# Patient Record
Sex: Female | Born: 1955 | Race: White | Hispanic: No | Marital: Married | Smoking: Never smoker
Health system: Southern US, Community
[De-identification: ages and names within clinical notes are randomized; demographics above are authoritative.]

## PROBLEM LIST (undated history)

## (undated) DIAGNOSIS — E049 Nontoxic goiter, unspecified: Secondary | ICD-10-CM

## (undated) DIAGNOSIS — R51 Headache: Secondary | ICD-10-CM

## (undated) DIAGNOSIS — Z780 Asymptomatic menopausal state: Secondary | ICD-10-CM

## (undated) HISTORY — DX: Asymptomatic menopausal state: Z78.0

## (undated) HISTORY — DX: Nontoxic goiter, unspecified: E04.9

## (undated) HISTORY — PX: EYE SURGERY: SHX253

## (undated) HISTORY — PX: DILATION AND CURETTAGE OF UTERUS: SHX78

## (undated) HISTORY — PX: COLONOSCOPY: SHX174

---

## 1998-09-01 ENCOUNTER — Other Ambulatory Visit: Admission: RE | Admit: 1998-09-01 | Discharge: 1998-09-01 | Payer: Self-pay | Admitting: Obstetrics and Gynecology

## 1999-10-15 ENCOUNTER — Other Ambulatory Visit: Admission: RE | Admit: 1999-10-15 | Discharge: 1999-10-15 | Payer: Self-pay | Admitting: Obstetrics and Gynecology

## 2000-11-07 ENCOUNTER — Other Ambulatory Visit: Admission: RE | Admit: 2000-11-07 | Discharge: 2000-11-07 | Payer: Self-pay | Admitting: Obstetrics and Gynecology

## 2002-05-31 ENCOUNTER — Other Ambulatory Visit: Admission: RE | Admit: 2002-05-31 | Discharge: 2002-05-31 | Payer: Self-pay | Admitting: Obstetrics and Gynecology

## 2003-08-14 ENCOUNTER — Ambulatory Visit (HOSPITAL_COMMUNITY): Admission: RE | Admit: 2003-08-14 | Discharge: 2003-08-14 | Payer: Self-pay | Admitting: Family Medicine

## 2003-09-30 ENCOUNTER — Encounter (INDEPENDENT_AMBULATORY_CARE_PROVIDER_SITE_OTHER): Payer: Self-pay | Admitting: Specialist

## 2003-09-30 ENCOUNTER — Ambulatory Visit (HOSPITAL_COMMUNITY): Admission: RE | Admit: 2003-09-30 | Discharge: 2003-09-30 | Payer: Self-pay | Admitting: General Surgery

## 2003-11-06 ENCOUNTER — Other Ambulatory Visit: Admission: RE | Admit: 2003-11-06 | Discharge: 2003-11-06 | Payer: Self-pay | Admitting: Obstetrics and Gynecology

## 2004-03-17 ENCOUNTER — Ambulatory Visit (HOSPITAL_COMMUNITY): Admission: RE | Admit: 2004-03-17 | Discharge: 2004-03-17 | Payer: Self-pay | Admitting: Endocrinology

## 2004-04-01 ENCOUNTER — Encounter (INDEPENDENT_AMBULATORY_CARE_PROVIDER_SITE_OTHER): Payer: Self-pay | Admitting: *Deleted

## 2004-04-01 ENCOUNTER — Ambulatory Visit (HOSPITAL_COMMUNITY): Admission: RE | Admit: 2004-04-01 | Discharge: 2004-04-01 | Payer: Self-pay | Admitting: Endocrinology

## 2005-02-11 ENCOUNTER — Ambulatory Visit (HOSPITAL_COMMUNITY): Admission: RE | Admit: 2005-02-11 | Discharge: 2005-02-11 | Payer: Self-pay | Admitting: Endocrinology

## 2005-02-24 IMAGING — US US BIOPSY
1 series · 12 of 12 positions shown · non-contrast
Comparison: none

CLINICAL DATA: Patient with history of multinodular goiter and previous ultrasound-guided thyroid biopsy of both left and right nodules on 09/30/03 with benign findings.  In the interim, the patient has undergone a follow-up ultrasound of the thyroid on 03/17/04 at [HOSPITAL] which revealed interval enlargement of the dominant lesion in the inferior aspect of the left thyroid lobe which previously measured 3.1 x 2.7 x 3.3 cm and now measures 3.3 x 2.8 x 3.5 cm with some peripheral calcifications.   Request is made for fine needle aspiration of this dominant inferior left thyroid lobe nodule.
ULTRASOUND-GUIDED FINE NEEDLE ASPIRATION OF DOMINANT LEFT THYROID LOBE NODULE ? 04/01/04

[Series 1: unknown · 0.09mm/px · 12 of 12 slices shown]
[im 1/12]
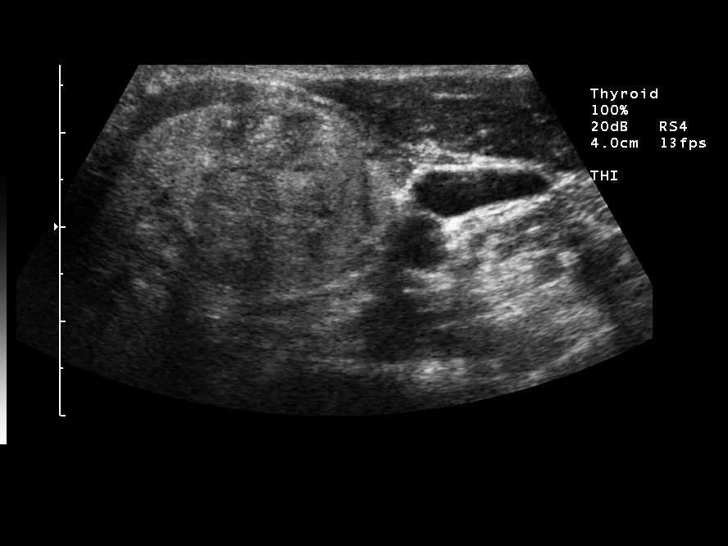
[im 2/12]
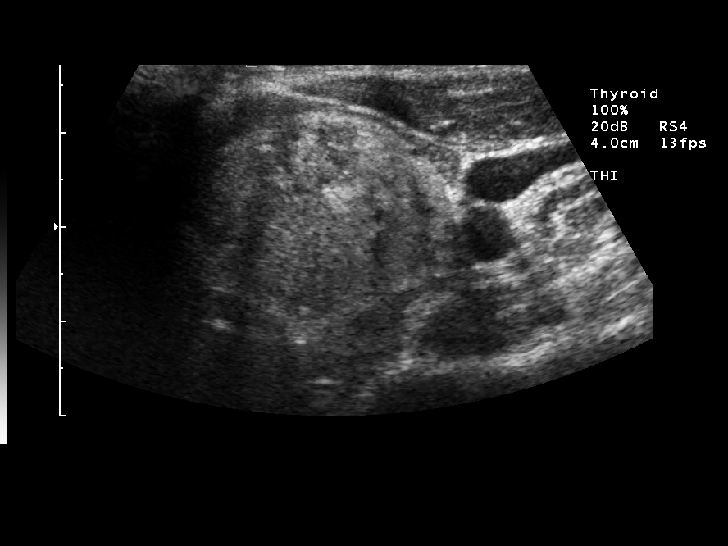
[im 3/12]
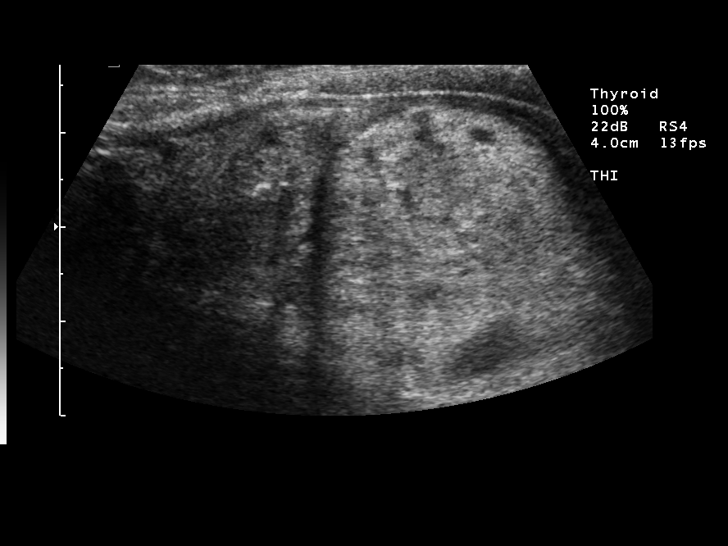
[im 4/12]
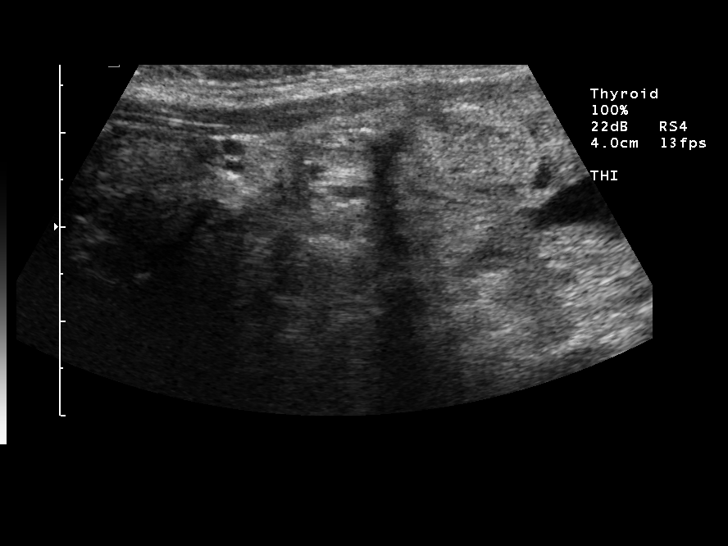
[im 5/12]
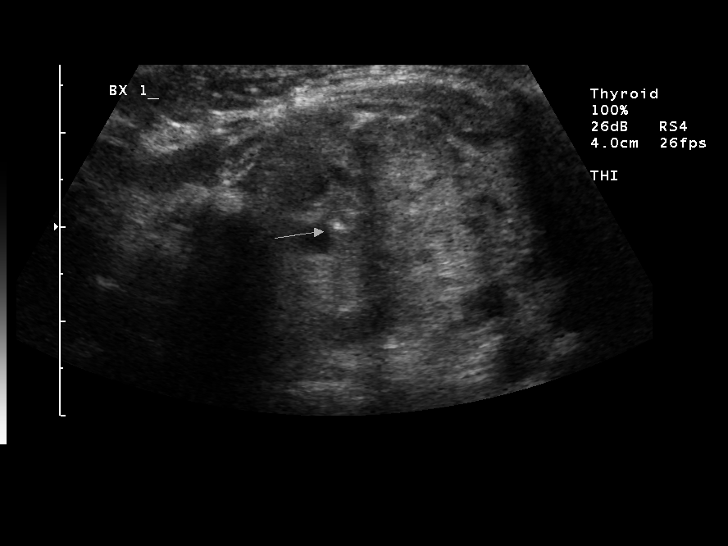
[im 6/12]
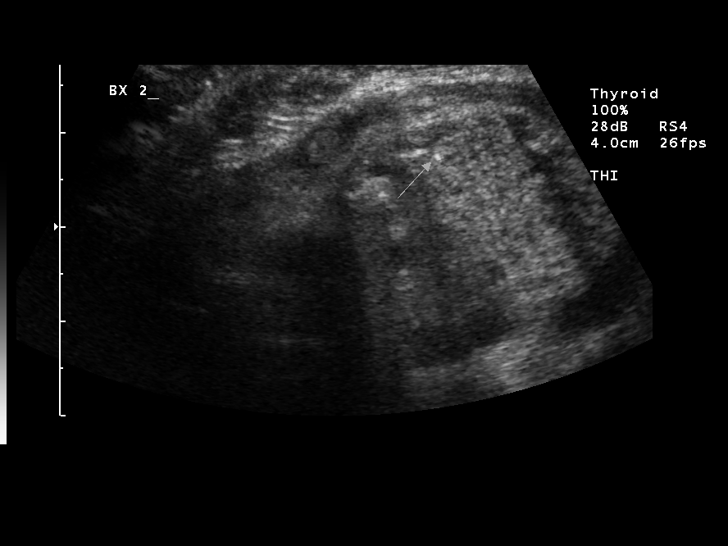
[im 7/12]
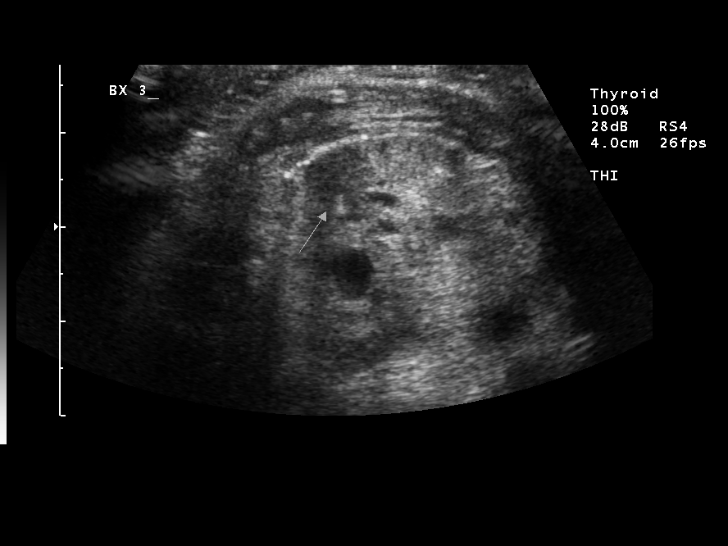
[im 8/12]
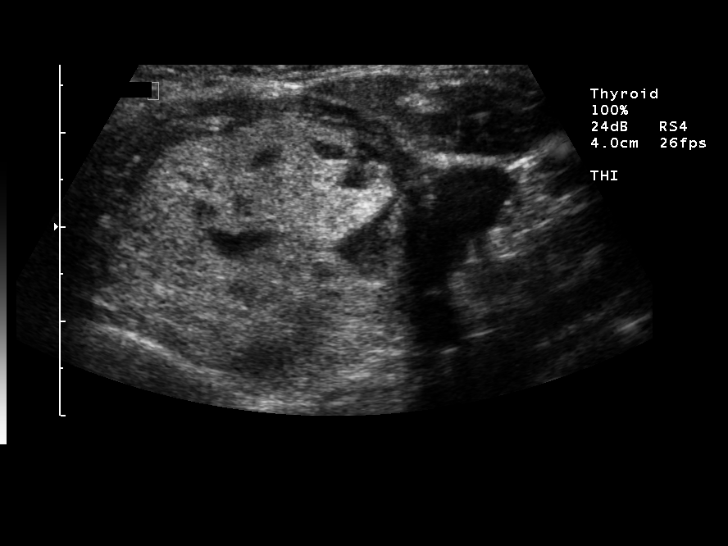
[im 9/12]
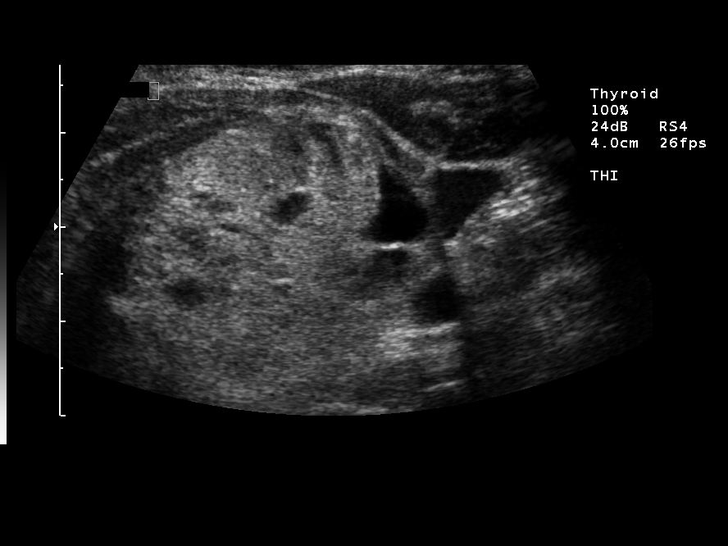
[im 10/12]
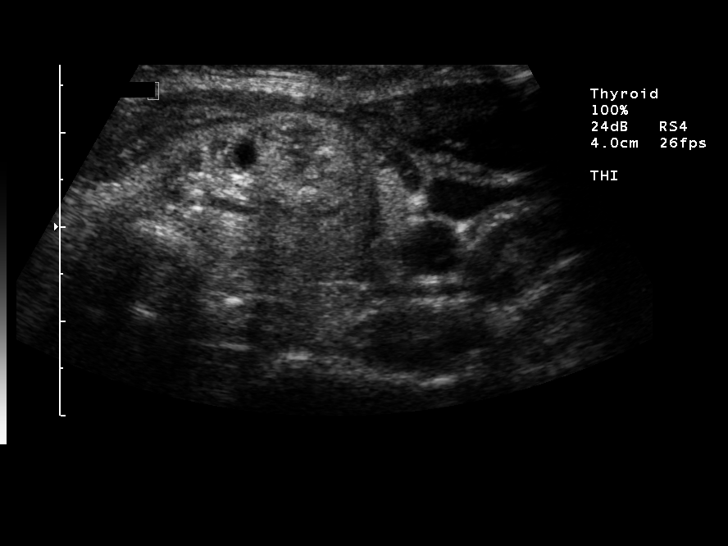
[im 11/12]
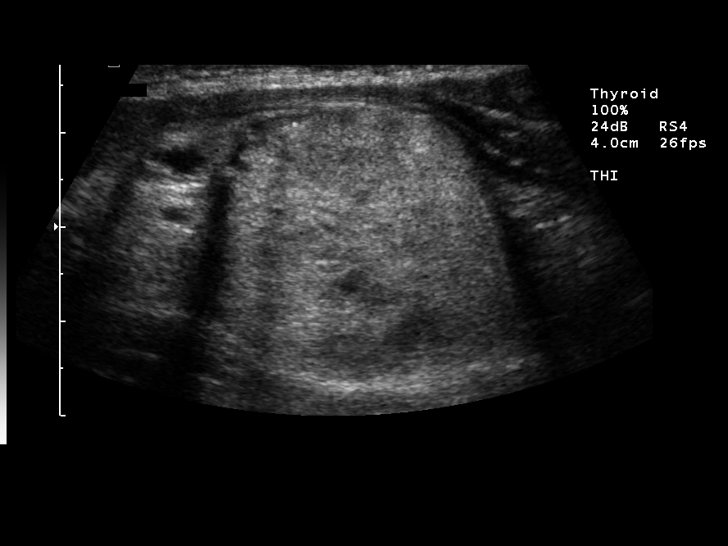
[im 12/12]
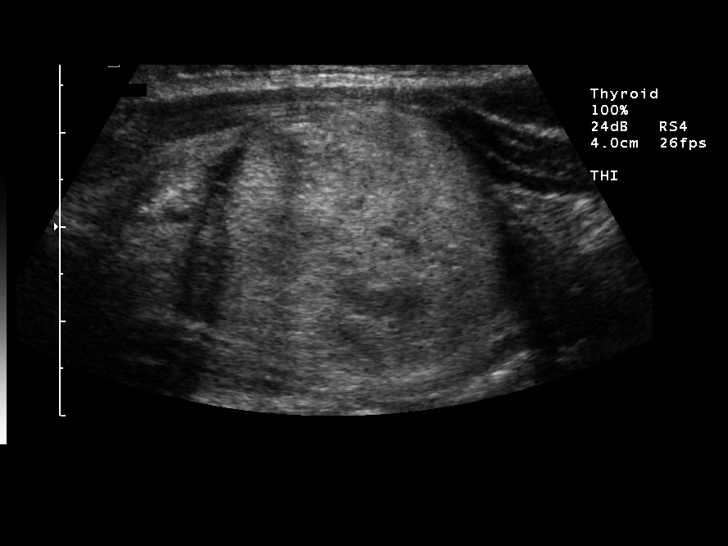

[12 of 12 positions shown; findings below may reference images not displayed]

FINDINGS: An ultrasound-guided thyroid biopsy was thoroughly discussed with the patient and questions were answered.  Risks and benefits of the procedure were also delineated.  Risks specifically discussed included bleeding, bruising, infection, and risk of injury to adjacent blood vessels and nerves.  The patient understands and wishes to proceed.  Verbal and written consent was obtained.
After the patient was prepped and draped in the normal sterile fashion, 1% lidocaine was used for local anesthesia.  Under direct ultrasound guidance, three passes were then made using 25 gauge hypodermic needles into the dominant left inferior  thyroid lobe nodule.  Ultrasound imaging confirmed appropriate needle placement in the nodule.  Specimens were given to cytology for further analysis.  The patient tolerated the procedure well and there were no immediate complications.  No hematoma was identified post-procedure.  The procedure was performed under the personal supervision of Dr. Agus Tn Rantica.  
IMPRESSION
Successful ultrasound-guided fine needle aspiration of a dominant left inferior pole thyroid lobe nodule as discussed above.

## 2005-08-01 ENCOUNTER — Ambulatory Visit (HOSPITAL_COMMUNITY): Admission: RE | Admit: 2005-08-01 | Discharge: 2005-08-01 | Payer: Self-pay | Admitting: Endocrinology

## 2006-10-13 ENCOUNTER — Encounter: Admission: RE | Admit: 2006-10-13 | Discharge: 2006-10-13 | Payer: Self-pay | Admitting: Endocrinology

## 2006-11-23 ENCOUNTER — Other Ambulatory Visit: Admission: RE | Admit: 2006-11-23 | Discharge: 2006-11-23 | Payer: Self-pay | Admitting: Interventional Radiology

## 2006-11-23 ENCOUNTER — Encounter (INDEPENDENT_AMBULATORY_CARE_PROVIDER_SITE_OTHER): Payer: Self-pay | Admitting: *Deleted

## 2006-11-23 ENCOUNTER — Encounter: Admission: RE | Admit: 2006-11-23 | Discharge: 2006-11-23 | Payer: Self-pay | Admitting: Endocrinology

## 2007-10-08 ENCOUNTER — Encounter: Admission: RE | Admit: 2007-10-08 | Discharge: 2007-10-08 | Payer: Self-pay | Admitting: Endocrinology

## 2008-04-11 ENCOUNTER — Encounter: Admission: RE | Admit: 2008-04-11 | Discharge: 2008-04-11 | Payer: Self-pay | Admitting: Endocrinology

## 2010-08-24 ENCOUNTER — Encounter
Admission: RE | Admit: 2010-08-24 | Discharge: 2010-08-24 | Payer: Self-pay | Source: Home / Self Care | Attending: Endocrinology | Admitting: Endocrinology

## 2011-09-13 ENCOUNTER — Other Ambulatory Visit: Payer: Self-pay | Admitting: Endocrinology

## 2011-09-13 DIAGNOSIS — E049 Nontoxic goiter, unspecified: Secondary | ICD-10-CM

## 2011-09-20 ENCOUNTER — Other Ambulatory Visit: Payer: Self-pay

## 2011-09-21 ENCOUNTER — Ambulatory Visit
Admission: RE | Admit: 2011-09-21 | Discharge: 2011-09-21 | Disposition: A | Discharge status: Home / Self Care | Payer: 59 | Source: Ambulatory Visit | Attending: Endocrinology | Admitting: Endocrinology

## 2011-09-21 DIAGNOSIS — E049 Nontoxic goiter, unspecified: Secondary | ICD-10-CM

## 2011-10-28 ENCOUNTER — Encounter (HOSPITAL_COMMUNITY): Payer: Self-pay | Admitting: Pharmacy Technician

## 2011-11-02 ENCOUNTER — Encounter (HOSPITAL_COMMUNITY)
Admission: RE | Admit: 2011-11-02 | Discharge: 2011-11-02 | Disposition: A | Discharge status: Home / Self Care | Payer: 59 | Source: Ambulatory Visit | Attending: Anesthesiology | Admitting: Anesthesiology

## 2011-11-02 ENCOUNTER — Encounter (HOSPITAL_COMMUNITY): Payer: Self-pay

## 2011-11-02 ENCOUNTER — Encounter (HOSPITAL_COMMUNITY)
Admission: RE | Admit: 2011-11-02 | Discharge: 2011-11-02 | Disposition: A | Discharge status: Home / Self Care | Payer: 59 | Source: Ambulatory Visit | Attending: Otolaryngology | Admitting: Otolaryngology

## 2011-11-02 ENCOUNTER — Other Ambulatory Visit (HOSPITAL_COMMUNITY): Payer: 59

## 2011-11-02 HISTORY — DX: Headache: R51

## 2011-11-02 LAB — BASIC METABOLIC PANEL
CO2: 30 mEq/L (ref 19–32)
Chloride: 105 mEq/L (ref 96–112)
Creatinine, Ser: 0.9 mg/dL (ref 0.50–1.10)
Potassium: 4 mEq/L (ref 3.5–5.1)

## 2011-11-02 LAB — SURGICAL PCR SCREEN
MRSA, PCR: NEGATIVE
Staphylococcus aureus: NEGATIVE

## 2011-11-02 LAB — CBC
HCT: 39.4 % (ref 36.0–46.0)
Hemoglobin: 13.6 g/dL (ref 12.0–15.0)
MCH: 30.5 pg (ref 26.0–34.0)
MCHC: 34.5 g/dL (ref 30.0–36.0)
MCV: 88.3 fL (ref 78.0–100.0)
Platelets: 212 10*3/uL (ref 150–400)
RBC: 4.46 MIL/uL (ref 3.87–5.11)
RDW: 12.8 % (ref 11.5–15.5)
WBC: 5.3 10*3/uL (ref 4.0–10.5)

## 2011-11-02 NOTE — H&P (Signed)
  Linda Esparza is an 56 y.o. female.   Chief Complaint: Thyroid goiter with compressive symptoms HPI: Long history of thyroid goiter, with compressive symptoms and exercise intolerance.  Past Medical History  Diagnosis Date  . Headache     migraines    Past Surgical History  Procedure Date  . Dilation and curettage of uterus   . Colonoscopy   . Eye surgery     left eye    No family history on file. Social History:  reports that she has never smoked. She does not have any smokeless tobacco history on file. She reports that she does not use illicit drugs. Her alcohol history not on file.  Allergies:  Allergies  Allergen Reactions  . Penicillins Rash    No current facility-administered medications on file as of .   Medications Prior to Admission  Medication Sig Dispense Refill  . cholecalciferol (VITAMIN D) 1000 UNITS tablet Take 1,000 Units by mouth daily.      . Cinnamon 500 MG TABS Take 1 tablet by mouth daily.      . Multiple Vitamin (MULITIVITAMIN WITH MINERALS) TABS Take 1 tablet by mouth daily.        No results found for this or any previous visit (from the past 48 hour(s)). No results found.  ROS: otherwise negative  There were no vitals taken for this visit.  PHYSICAL EXAM: Overall appearance:  Healthy appearing, in no distress Head:  Normocephalic, atraumatic. Ears: External auditory canals are clear; tympanic membranes are intact and the middle ears are free of any effusion. Nose: External nose is healthy in appearance. Internal nasal exam free of any lesions or obstruction. Oral Cavity:  There are no mucosal lesions or masses identified. Oral Pharynx/Hypopharynx/Larynx: no signs of any mucosal lesions or masses identified. Vocal cords move normally. Neuro:  No identifiable neurologic deficits. Neck:Very large bilateral multinodular goiter..  Studies Reviewed: none    Assessment/Plan Proceed with total thyroidectomy.  [name] 11/02/2011, 9:33  AM

## 2011-11-02 NOTE — Pre-Procedure Instructions (Signed)
[name]  11/02/2011   Your procedure is scheduled on:  November 08, 2011 (Tuesday)  Report to [facility] at 5:30 AM.  Call this number if you have problems the morning of surgery: [phone]   Remember:   Do not eat food:After Midnight.  May have clear liquids: up to 4 Hours before arrival.  Clear liquids include soda, tea, black coffee, apple or grape juice, broth.  Take these medicines the morning of surgery with A SIP OF WATER: none   Do not wear jewelry, make-up or nail polish.  Do not wear lotions, powders, or perfumes. You may wear deodorant.  Do not shave 48 hours prior to surgery.  Do not bring valuables to the hospital.  Contacts, dentures or bridgework may not be worn into surgery.  Leave suitcase in the car. After surgery it may be brought to your room.  For patients admitted to the hospital, checkout time is 11:00 AM the day of discharge.   Patients discharged the day of surgery will not be allowed to drive home.  Name and phone number of your driver: Richard  Special Instructions: CHG Shower Use Special Wash: 1/2 bottle night before surgery and 1/2 bottle morning of surgery.   Please read over the following fact sheets that you were given: Pain Booklet, MRSA Information and Surgical Site Infection Prevention

## 2011-11-08 ENCOUNTER — Encounter (HOSPITAL_COMMUNITY): Payer: Self-pay | Admitting: *Deleted

## 2011-11-08 ENCOUNTER — Encounter (HOSPITAL_COMMUNITY): Payer: Self-pay | Admitting: Anesthesiology

## 2011-11-08 ENCOUNTER — Ambulatory Visit (HOSPITAL_COMMUNITY)
Admission: RE | Admit: 2011-11-08 | Discharge: 2011-11-09 | Disposition: A | Discharge status: Home / Self Care | Payer: 59 | Source: Ambulatory Visit | Attending: Otolaryngology | Admitting: Otolaryngology

## 2011-11-08 ENCOUNTER — Ambulatory Visit (HOSPITAL_COMMUNITY): Payer: 59 | Admitting: Anesthesiology

## 2011-11-08 ENCOUNTER — Encounter (HOSPITAL_COMMUNITY)
Admission: RE | Disposition: A | Discharge status: Home / Self Care | Payer: Self-pay | Source: Ambulatory Visit | Attending: Otolaryngology

## 2011-11-08 DIAGNOSIS — E049 Nontoxic goiter, unspecified: Secondary | ICD-10-CM

## 2011-11-08 DIAGNOSIS — Z01812 Encounter for preprocedural laboratory examination: Secondary | ICD-10-CM | POA: Insufficient documentation

## 2011-11-08 DIAGNOSIS — E042 Nontoxic multinodular goiter: Secondary | ICD-10-CM | POA: Insufficient documentation

## 2011-11-08 DIAGNOSIS — Z01818 Encounter for other preprocedural examination: Secondary | ICD-10-CM | POA: Insufficient documentation

## 2011-11-08 HISTORY — PX: THYROID LOBECTOMY: SHX420

## 2011-11-08 SURGERY — LOBECTOMY, THYROID
Anesthesia: General | Site: Neck | Laterality: Left | Wound class: Clean

## 2011-11-08 MED ORDER — ROCURONIUM BROMIDE 100 MG/10ML IV SOLN
INTRAVENOUS | Status: DC | PRN
  Administered 2011-11-08: 40 mg via INTRAVENOUS
  Administered 2011-11-08: 10 mg via INTRAVENOUS

## 2011-11-08 MED ORDER — ONDANSETRON HCL 4 MG/2ML IJ SOLN: 4.0000 mg | Freq: Four times a day (QID) | INTRAMUSCULAR | Status: DC | PRN

## 2011-11-08 MED ORDER — VITAMIN D3 25 MCG (1000 UNIT) PO TABS
1000.0000 [IU] | ORAL_TABLET | Freq: Every day | ORAL | Status: DC
  Filled 2011-11-08 (×2): qty 1

## 2011-11-08 MED ORDER — PROMETHAZINE HCL 25 MG RE SUPP: 25.0000 mg | Freq: Four times a day (QID) | RECTAL | Status: AC | PRN

## 2011-11-08 MED ORDER — CINNAMON 500 MG PO TABS: 1.0000 | ORAL_TABLET | Freq: Every day | ORAL | Status: DC

## 2011-11-08 MED ORDER — PROPOFOL 10 MG/ML IV EMUL
INTRAVENOUS | Status: DC | PRN
  Administered 2011-11-08: 160 mg via INTRAVENOUS

## 2011-11-08 MED ORDER — PROMETHAZINE HCL 25 MG PO TABS
25.0000 mg | ORAL_TABLET | Freq: Four times a day (QID) | ORAL | Status: DC | PRN
  Filled 2011-11-08: qty 1

## 2011-11-08 MED ORDER — ONDANSETRON HCL 4 MG/2ML IJ SOLN
INTRAMUSCULAR | Status: DC | PRN
  Administered 2011-11-08 (×2): 4 mg via INTRAVENOUS

## 2011-11-08 MED ORDER — PROMETHAZINE HCL 25 MG/ML IJ SOLN: 6.2500 mg | INTRAMUSCULAR | Status: DC | PRN

## 2011-11-08 MED ORDER — CLINDAMYCIN PHOSPHATE 600 MG/50ML IV SOLN
INTRAVENOUS | Status: DC | PRN
  Administered 2011-11-08: 600 mg via INTRAVENOUS

## 2011-11-08 MED ORDER — SODIUM CHLORIDE 0.9 % IR SOLN
Status: DC | PRN
  Administered 2011-11-08: 1000 mL

## 2011-11-08 MED ORDER — LACTATED RINGERS IV SOLN: INTRAVENOUS | Status: DC

## 2011-11-08 MED ORDER — DEXTROSE-NACL 5-0.9 % IV SOLN
INTRAVENOUS | Status: DC
  Administered 2011-11-09: 07:00:00 via INTRAVENOUS

## 2011-11-08 MED ORDER — HYDROMORPHONE HCL PF 1 MG/ML IJ SOLN
0.2500 mg | INTRAMUSCULAR | Status: DC | PRN
  Administered 2011-11-08 (×3): 0.5 mg via INTRAVENOUS

## 2011-11-08 MED ORDER — MEPERIDINE HCL 25 MG/ML IJ SOLN: 6.2500 mg | INTRAMUSCULAR | Status: DC | PRN

## 2011-11-08 MED ORDER — IBUPROFEN 100 MG/5ML PO SUSP
400.0000 mg | Freq: Four times a day (QID) | ORAL | Status: DC | PRN
  Administered 2011-11-08: 400 mg via ORAL
  Filled 2011-11-08: qty 20

## 2011-11-08 MED ORDER — PROMETHAZINE HCL 25 MG RE SUPP
25.0000 mg | Freq: Four times a day (QID) | RECTAL | Status: DC | PRN
  Filled 2011-11-08: qty 1

## 2011-11-08 MED ORDER — CEFAZOLIN SODIUM 1-5 GM-% IV SOLN
INTRAVENOUS | Status: AC
  Filled 2011-11-08: qty 50

## 2011-11-08 MED ORDER — LACTATED RINGERS IV SOLN
INTRAVENOUS | Status: DC | PRN
  Administered 2011-11-08 (×2): via INTRAVENOUS

## 2011-11-08 MED ORDER — HYDROCODONE-ACETAMINOPHEN 7.5-500 MG PO TABS: 1.0000 | ORAL_TABLET | Freq: Four times a day (QID) | ORAL | Status: AC | PRN

## 2011-11-08 MED ORDER — MORPHINE SULFATE 2 MG/ML IJ SOLN
2.0000 mg | Freq: Once | INTRAMUSCULAR | Status: AC
  Administered 2011-11-08: 2 mg via INTRAVENOUS
  Filled 2011-11-08: qty 1

## 2011-11-08 MED ORDER — ADULT MULTIVITAMIN W/MINERALS CH
1.0000 | ORAL_TABLET | Freq: Every day | ORAL | Status: DC
  Filled 2011-11-08 (×2): qty 1

## 2011-11-08 MED ORDER — FENTANYL CITRATE 0.05 MG/ML IJ SOLN
INTRAMUSCULAR | Status: DC | PRN
  Administered 2011-11-08: 100 ug via INTRAVENOUS
  Administered 2011-11-08: 50 ug via INTRAVENOUS
  Administered 2011-11-08: 100 ug via INTRAVENOUS

## 2011-11-08 MED ORDER — MIDAZOLAM HCL 5 MG/5ML IJ SOLN
INTRAMUSCULAR | Status: DC | PRN
  Administered 2011-11-08: 2 mg via INTRAVENOUS

## 2011-11-08 MED ORDER — ASPIRIN-ACETAMINOPHEN-CAFFEINE 250-250-65 MG PO TABS
2.0000 | ORAL_TABLET | Freq: Four times a day (QID) | ORAL | Status: DC | PRN
  Filled 2011-11-08: qty 2

## 2011-11-08 MED ORDER — GLYCOPYRROLATE 0.2 MG/ML IJ SOLN
INTRAMUSCULAR | Status: DC | PRN
  Administered 2011-11-08: .4 mg via INTRAVENOUS

## 2011-11-08 MED ORDER — HYDROMORPHONE HCL PF 1 MG/ML IJ SOLN
INTRAMUSCULAR | Status: AC
  Filled 2011-11-08: qty 1

## 2011-11-08 MED ORDER — HYDROCODONE-ACETAMINOPHEN 5-325 MG PO TABS
1.0000 | ORAL_TABLET | ORAL | Status: DC | PRN
  Administered 2011-11-08: 1 via ORAL
  Filled 2011-11-08: qty 1

## 2011-11-08 MED ORDER — NEOSTIGMINE METHYLSULFATE 1 MG/ML IJ SOLN
INTRAMUSCULAR | Status: DC | PRN
  Administered 2011-11-08: 3 mg via INTRAVENOUS

## 2011-11-08 SURGICAL SUPPLY — 50 items
APPLIER CLIP 9.375 SM OPEN (CLIP)
APR CLP SM 9.3 20 MLT OPN (CLIP)
ATTRACTOMAT 16X20 MAGNETIC DRP (DRAPES) ×3 IMPLANT
CANISTER SUCTION 2500CC (MISCELLANEOUS) ×3 IMPLANT
CLEANER TIP ELECTROSURG 2X2 (MISCELLANEOUS) ×3 IMPLANT
CLIP APPLIE 9.375 SM OPEN (CLIP) IMPLANT
CLOTH BEACON ORANGE TIMEOUT ST (SAFETY) ×3 IMPLANT
CONT SPEC 4OZ CLIKSEAL STRL BL (MISCELLANEOUS) IMPLANT
CORDS BIPOLAR (ELECTRODE) ×3 IMPLANT
COVER SURGICAL LIGHT HANDLE (MISCELLANEOUS) ×3 IMPLANT
DECANTER SPIKE VIAL GLASS SM (MISCELLANEOUS) IMPLANT
DERMABOND ADVANCED (GAUZE/BANDAGES/DRESSINGS) ×1
DERMABOND ADVANCED .7 DNX12 (GAUZE/BANDAGES/DRESSINGS) ×2 IMPLANT
DRAIN JACKSON RD 7FR 3/32 (WOUND CARE) IMPLANT
DRAIN SNY 10 ROU (WOUND CARE) ×3 IMPLANT
ELECT COATED BLADE 2.86 ST (ELECTRODE) ×3 IMPLANT
ELECT REM PT RETURN 9FT ADLT (ELECTROSURGICAL) ×3
ELECTRODE REM PT RTRN 9FT ADLT (ELECTROSURGICAL) ×2 IMPLANT
EVACUATOR SILICONE 100CC (DRAIN) ×3 IMPLANT
GAUZE SPONGE 4X4 16PLY XRAY LF (GAUZE/BANDAGES/DRESSINGS) ×6 IMPLANT
GLOVE BIO SURGEON STRL SZ 6 (GLOVE) ×3 IMPLANT
GLOVE BIO SURGEON STRL SZ 6.5 (GLOVE) ×3 IMPLANT
GLOVE BIOGEL PI IND STRL 7.0 (GLOVE) ×2 IMPLANT
GLOVE BIOGEL PI INDICATOR 7.0 (GLOVE) ×1
GLOVE ECLIPSE 6.5 STRL STRAW (GLOVE) ×6 IMPLANT
GLOVE ECLIPSE 7.5 STRL STRAW (GLOVE) ×3 IMPLANT
GLOVE SURG SS PI 6.5 STRL IVOR (GLOVE) ×3 IMPLANT
GOWN STRL NON-REIN LRG LVL3 (GOWN DISPOSABLE) ×15 IMPLANT
KIT BASIN OR (CUSTOM PROCEDURE TRAY) ×3 IMPLANT
KIT ROOM TURNOVER OR (KITS) ×3 IMPLANT
NEEDLE 27GAX1X1/2 (NEEDLE) ×3 IMPLANT
NS IRRIG 1000ML POUR BTL (IV SOLUTION) ×3 IMPLANT
PAD ARMBOARD 7.5X6 YLW CONV (MISCELLANEOUS) ×6 IMPLANT
PENCIL FOOT CONTROL (ELECTRODE) ×3 IMPLANT
SPECIMEN JAR MEDIUM (MISCELLANEOUS) ×3 IMPLANT
SPONGE INTESTINAL PEANUT (DISPOSABLE) IMPLANT
STAPLER VISISTAT 35W (STAPLE) ×3 IMPLANT
SUT CHROMIC 3 0 SH 27 (SUTURE) IMPLANT
SUT CHROMIC 4 0 PS 2 18 (SUTURE) ×6 IMPLANT
SUT ETHILON 3 0 PS 1 (SUTURE) IMPLANT
SUT ETHILON 5 0 P 3 18 (SUTURE) ×1
SUT NYLON ETHILON 5-0 P-3 1X18 (SUTURE) ×2 IMPLANT
SUT SILK 3 0 REEL (SUTURE) IMPLANT
SUT SILK 4 0 REEL (SUTURE) ×6 IMPLANT
TOWEL NATURAL 4PK STERILE (DISPOSABLE) IMPLANT
TOWEL OR 17X24 6PK STRL BLUE (TOWEL DISPOSABLE) ×3 IMPLANT
TOWEL OR 17X26 10 PK STRL BLUE (TOWEL DISPOSABLE) ×3 IMPLANT
TOWEL OR NON WOVEN STRL DISP B (DISPOSABLE) ×3 IMPLANT
TRAY ENT MC OR (CUSTOM PROCEDURE TRAY) ×3 IMPLANT
WATER STERILE IRR 1000ML POUR (IV SOLUTION) ×3 IMPLANT

## 2011-11-08 NOTE — Preoperative (Signed)
Beta Blockers   Reason not to administer Beta Blockers:Not Applicable 

## 2011-11-08 NOTE — Anesthesia Preprocedure Evaluation (Addendum)
Anesthesia Evaluation  Patient identified by MRN, date of birth, ID band Patient awake    Reviewed: Allergy & Precautions, H&P , NPO status , Patient's Chart, lab work & pertinent test results  Airway Mallampati: II TM Distance: >3 FB Neck ROM: Full  Mouth opening: Limited Mouth Opening  Dental  (+) Teeth Intact   Pulmonary          Cardiovascular     Neuro/Psych  Headaches,    GI/Hepatic   Endo/Other    Renal/GU      Musculoskeletal   Abdominal   Peds  Hematology   Anesthesia Other Findings   Reproductive/Obstetrics                           Anesthesia Physical Anesthesia Plan  ASA: I  Anesthesia Plan: General   Post-op Pain Management:    Induction: Intravenous  Airway Management Planned: Oral ETT  Additional Equipment:   Intra-op Plan:   Post-operative Plan: Extubation in OR  Informed Consent: I have reviewed the patients History and Physical, chart, labs and discussed the procedure including the risks, benefits and alternatives for the proposed anesthesia with the patient or authorized representative who has indicated his/her understanding and acceptance.   Dental advisory given  Plan Discussed with: CRNA, Anesthesiologist and Surgeon  Anesthesia Plan Comments:        Anesthesia Quick Evaluation

## 2011-11-08 NOTE — Interval H&P Note (Signed)
History and Physical Interval Note:  11/08/2011 7:19 AM  Linda Esparza  has presented today for surgery, with the diagnosis of multi nodular gortior  The various methods of treatment have been discussed with the patient and family. After consideration of risks, benefits and other options for treatment, the patient has consented to  Procedure(s) (LRB): THYROIDECTOMY (Bilateral) as a surgical intervention .  The patients' history has been reviewed, patient examined, no change in status, stable for surgery.  I have reviewed the patients' chart and labs.  Questions were answered to the patient's satisfaction.     Burnie Therien

## 2011-11-08 NOTE — Transfer of Care (Signed)
Immediate Anesthesia Transfer of Care Note  Patient: Linda Esparza  Procedure(s) Performed: Procedure(s) (LRB): THYROID LOBECTOMY (Left)  Patient Location: PACU  Anesthesia Type: General  Level of Consciousness: awake and alert   Airway & Oxygen Therapy: Patient Spontanous Breathing and Patient connected to nasal cannula oxygen  Post-op Assessment: Report given to PACU RN  Post vital signs: Reviewed and stable  Complications: No apparent anesthesia complications

## 2011-11-08 NOTE — OR Nursing (Signed)
Left thyroid lobe specimen transported to pathology for frozen by Darron Doom, RN. Pathology called the operating room to confirm that they had received it at 8:54. Pathology called with results at 9:11.

## 2011-11-08 NOTE — Op Note (Signed)
OPERATIVE REPORT  DATE OF SURGERY: 11/08/2011  PATIENT:  Linda Esparza,  56 y.o. female  PRE-OPERATIVE DIAGNOSIS:  Multinodular goiter with compressive symptoms.  POST-OPERATIVE DIAGNOSIS:  Multinodular goiter with compressive symptoms.  PROCEDURE:  Procedure(s): THYROID LOBECTOMY  SURGEON:  Susy Frizzle, MD  ASSISTANTS: Aquilla Hacker, PA   ANESTHESIA:   general  EBL:  30 ml  DRAINS: (10 Fr) Jackson-Pratt drain(s) with closed bulb suction in the neck   LOCAL MEDICATIONS USED:  NONE  SPECIMEN:  Source of Specimen:  Left thyroid lobe  COUNTS:  YES  PROCEDURE DETAILS: The patient was taken to the operating room and placed on the operating table in the supine position. Following induction of general endotracheal anesthesia, a shoulder roll was placed beneath the shoulder blades. The neck was prepped and draped in a standard fashion. A transverse incision was outlined in a low cervical skin crease. Electrocautery was used to incise the skin, subcutaneous tissue and the platysma layer. Subplatysmal flaps were developed superiorly to the thyroid ala, and inferiorly to the clavicle. A self-retaining retractor was used throughout the case. The midline fascia was divided and reflected laterally off the left lobe of the thyroid. The left lobe was very much enlarged and was deviating the trachea and the larynx about 3-4 cm towards the right of midline. On further inspection, the right lobe was very small. The strap muscles were reflected laterally off the left lobe. The left lobe was reflected medially and the superior pole was dissected first. Taking care to stay right on the capsule of the gland, the superior vasculature was separately identified, ligated between clamps and divided. 4-0 silk ties were used throughout the case. The middle thyroid vein was ligated in a similar fashion and divided. As the lobe was brought forward finger dissection was used to free it from surrounding tissue. 2  possible parathyroid glands were identified and preserved with their blood supplies, the recurrent nerve was identified and preserved as well. The inferior vasculature was separately identified, ligated between clamps and divided. The gland was brought forward off the trachea and the ligament of Allyson Sabal was divided. The isthmus was divided using a clamp and was then tied. The wound was irrigated with saline and suctioned. Additional ties and bipolar cautery was used for completion hemostasis. A 10 French drain was left in the wound and exited to the right side of the incision and secured in place with a nylon suture. The midline fascia was reapproximated with chromic suture. The platysma layer and a subcuticular layer were similarly reapproximated. Dermabond was used on the skin. The patient was awakened, extubated and transferred to recovery in stable condition. The specimen was sent for frozen section analysis and there was no evidence of carcinoma.    PATIENT DISPOSITION:  PACU - hemodynamically stable.

## 2011-11-08 NOTE — Discharge Instructions (Signed)
You may take a shower, and use soap and water. She does not use any cream, oral or motion on the incision.

## 2011-11-08 NOTE — Anesthesia Postprocedure Evaluation (Signed)
  Anesthesia Post-op Note  Patient: Linda Esparza  Procedure(s) Performed: Procedure(s) (LRB): THYROID LOBECTOMY (Left)  Patient Location: PACU  Anesthesia Type: General  Level of Consciousness: awake  Airway and Oxygen Therapy: Patient Spontanous Breathing  Post-op Pain: mild  Post-op Assessment: Post-op Vital signs reviewed  Post-op Vital Signs: stable  Complications: No apparent anesthesia complications

## 2011-11-08 NOTE — Progress Notes (Signed)
Subjective: POD#0 from thyroidectomy, doing well other than some nausea and a migraine  Objective: Vital signs in last 24 hours: Temp:  [96.8 F (36 C)-98.1 F (36.7 C)] 97.3 F (36.3 C) (03/12 1603) Pulse Rate:  [52-93] 58  (03/12 1603) Resp:  [9-22] 16  (03/12 1603) BP: (124-165)/(57-97) 136/74 mmHg (03/12 1603) SpO2:  [91 %-100 %] 92 % (03/12 1603) Weight:  [70.444 kg (155 lb 4.8 oz)] 70.444 kg (155 lb 4.8 oz) (03/12 1603)  Strong voice, neck supple, drain holding suction with serosanguinous drainage, no hematoma, awake, alert, NAD    Assessment/Plan: excedrin for migraine, zofran for nausea Monitor drain output   LOS: 0 days   Melvenia Beam 11/08/2011, 5:53 PM

## 2011-11-08 NOTE — Progress Notes (Signed)
PHARMACIST - PHYSICIAN ORDER COMMUNICATION  CONCERNING: P&T Medication Policy on Herbal Medications  DESCRIPTION:  This patient's order for: cinnamon tablet has been noted.  This product(s) is classified as an "herbal" or natural product. Due to a lack of definitive safety studies or FDA approval, nonstandard manufacturing practices, plus the potential risk of unknown drug-drug interactions while on inpatient medications, the Pharmacy and Therapeutics Committee does not permit the use of "herbal" or natural products of this type within [facility].   ACTION TAKEN: The pharmacy department is unable to verify this order at this time and your patient has been informed of this safety policy. Please reevaluate patient's clinical condition at discharge and address if the herbal or natural product(s) should be resumed at that time.

## 2011-11-09 NOTE — Discharge Summary (Signed)
  Physician Discharge Summary  Patient ID: Linda Esparza MRN: [id number] DOB/AGE: 1955/12/02 56 y.o.  Admit date: 11/08/2011 Discharge date: 11/09/2011  Admission Diagnoses:Thyroid goiter  Discharge Diagnoses:  Active Problems:  * No active hospital problems. *    Discharged Condition: good  Hospital Course: No complications other than a migraine.  Consults: None  Significant Diagnostic Studies: angiography: none  Treatments: surgery: Thyroid lobectomy  Discharge Exam: Blood pressure 102/56, pulse 76, temperature 98.4 F (36.9 C), temperature source Oral, resp. rate 18, height 5\' 2"  (1.575 m), weight 70.444 kg (155 lb 4.8 oz), SpO2 99.00%. PHYSICAL EXAM: Incision looks excellent, voice normal. JP drain removed.  Disposition: Final discharge disposition not confirmed  Discharge Orders    Future Orders Please Complete By Expires   Diet - low sodium heart healthy      Increase activity slowly        Medication List  As of 11/09/2011  8:26 AM   TAKE these medications         cholecalciferol 1000 UNITS tablet   Commonly known as: VITAMIN D   Take 1,000 Units by mouth daily.      Cinnamon 500 MG Tabs   Take 1 tablet by mouth daily.      HYDROcodone-acetaminophen 7.5-500 MG per tablet   Commonly known as: LORTAB   Take 1 tablet by mouth every 6 (six) hours as needed for pain.      mulitivitamin with minerals Tabs   Take 1 tablet by mouth daily.      promethazine 25 MG suppository   Commonly known as: PHENERGAN   Place 1 suppository (25 mg total) rectally every 6 (six) hours as needed for nausea.           Follow-up Information    Follow up with Serena Colonel, MD. Call in 1 week.   Contact information:   [address], [address], [address] [place] [address] [phone]          Signed: Serena Colonel 11/09/2011, 8:26 AM

## 2011-11-14 ENCOUNTER — Encounter (HOSPITAL_COMMUNITY): Payer: Self-pay | Admitting: Otolaryngology

## 2013-04-01 ENCOUNTER — Other Ambulatory Visit: Payer: Self-pay | Admitting: Dermatology

## 2014-04-23 ENCOUNTER — Other Ambulatory Visit: Payer: Self-pay | Admitting: Dermatology

## 2019-10-03 ENCOUNTER — Ambulatory Visit: Payer: BC Managed Care – PPO | Attending: Internal Medicine

## 2019-10-03 DIAGNOSIS — Z20822 Contact with and (suspected) exposure to covid-19: Secondary | ICD-10-CM

## 2019-10-04 LAB — NOVEL CORONAVIRUS, NAA: SARS-CoV-2, NAA: NOT DETECTED

## 2019-10-07 ENCOUNTER — Ambulatory Visit: Payer: BC Managed Care – PPO | Attending: Internal Medicine

## 2019-10-07 DIAGNOSIS — Z20822 Contact with and (suspected) exposure to covid-19: Secondary | ICD-10-CM

## 2019-10-08 ENCOUNTER — Encounter: Payer: Self-pay | Admitting: *Deleted

## 2019-10-08 LAB — NOVEL CORONAVIRUS, NAA: SARS-CoV-2, NAA: DETECTED — AB

## 2021-04-19 ENCOUNTER — Other Ambulatory Visit: Payer: Self-pay

## 2021-04-19 ENCOUNTER — Ambulatory Visit
Admission: RE | Admit: 2021-04-19 | Discharge: 2021-04-19 | Disposition: A | Discharge status: Home / Self Care | Payer: BC Managed Care – PPO | Source: Ambulatory Visit | Attending: Emergency Medicine | Admitting: Emergency Medicine

## 2021-04-19 VITALS — BP 177/78 | HR 95 | Temp 98.0°F | Resp 18

## 2021-04-19 DIAGNOSIS — R059 Cough, unspecified: Secondary | ICD-10-CM | POA: Diagnosis not present

## 2021-04-19 DIAGNOSIS — J22 Unspecified acute lower respiratory infection: Secondary | ICD-10-CM | POA: Diagnosis not present

## 2021-04-19 MED ORDER — GUAIFENESIN-CODEINE 100-10 MG/5ML PO SOLN: 5.0000 mL | Freq: Every evening | ORAL | 0 refills | Status: AC | PRN

## 2021-04-19 MED ORDER — AZITHROMYCIN 250 MG PO TABS: 250.0000 mg | ORAL_TABLET | Freq: Every day | ORAL | 0 refills | Status: AC

## 2021-04-19 MED ORDER — BENZONATATE 200 MG PO CAPS: 200.0000 mg | ORAL_CAPSULE | Freq: Three times a day (TID) | ORAL | 0 refills | Status: AC | PRN

## 2021-04-19 NOTE — ED Provider Notes (Signed)
[facility]    CSN: [id number] Arrival date & time: 04/19/21  5852      History   Chief Complaint Chief Complaint  Patient presents with   Cough    HPI Linda Esparza is a 65 y.o. female presenting today for evaluation of cough.  Reports persistent cough over the past 2 weeks.  Initially with sore throat, but this is resolved.  Reports minimal sinus symptoms.  Persistent nighttime cough.  Denies shortness of breath.  Denies history of asthma, tobacco use.  Using Delsym and Mucinex without full relief of symptoms.  HPI  Past Medical History:  Diagnosis Date   Headache(784.0)    migraines    There are no problems to display for this patient.   Past Surgical History:  Procedure Laterality Date   COLONOSCOPY     DILATION AND CURETTAGE OF UTERUS     EYE SURGERY     left eye   THYROID LOBECTOMY  11/08/2011   Procedure: THYROID LOBECTOMY;  Surgeon: Serena Colonel, MD;  Location: [facility] OR;  Service: ENT;  Laterality: Left;    OB History   No obstetric history on file.      Home Medications    Prior to Admission medications   Medication Sig Start Date End Date Taking? Authorizing Provider  azithromycin (ZITHROMAX) 250 MG tablet Take 1 tablet (250 mg total) by mouth daily. Take first 2 tablets together, then 1 every day until finished. 04/19/21  Yes Kam Kushnir C, PA-C  benzonatate (TESSALON) 200 MG capsule Take 1 capsule (200 mg total) by mouth 3 (three) times daily as needed for up to 7 days for cough. 04/19/21 04/26/21 Yes Kshawn Canal C, PA-C  guaiFENesin-codeine 100-10 MG/5ML syrup Take 5-10 mLs by mouth at bedtime as needed for cough. 04/19/21  Yes Winta Barcelo C, PA-C  cholecalciferol (VITAMIN D) 1000 UNITS tablet Take 1,000 Units by mouth daily.    [provider]  Cinnamon 500 MG TABS Take 1 tablet by mouth daily.    [provider]  Multiple Vitamin (MULITIVITAMIN WITH MINERALS) TABS Take 1 tablet by mouth daily.    [provider]    Family History History reviewed. No pertinent family history.  Social History Social History   Tobacco Use   Smoking status: Never  Substance Use Topics   Drug use: No     Allergies   Penicillins   Review of Systems Review of Systems  Constitutional:  Negative for activity change, appetite change, chills, fatigue and fever.  HENT:  Positive for congestion. Negative for ear pain, rhinorrhea, sinus pressure, sore throat and trouble swallowing.   Eyes:  Negative for discharge and redness.  Respiratory:  Positive for cough. Negative for chest tightness and shortness of breath.   Cardiovascular:  Negative for chest pain.  Gastrointestinal:  Negative for abdominal pain, diarrhea, nausea and vomiting.  Musculoskeletal:  Negative for myalgias.  Skin:  Negative for rash.  Neurological:  Negative for dizziness, light-headedness and headaches.    Physical Exam Triage Vital Signs ED Triage Vitals  Enc Vitals Group     BP 04/19/21 1005 (!) 177/78     Pulse Rate 04/19/21 1005 95     Resp 04/19/21 1005 18     Temp 04/19/21 1005 98 F (36.7 C)     Temp Source 04/19/21 1005 Oral     SpO2 04/19/21 1005 97 %     Weight --      Height --  Head Circumference --      Peak Flow --      Pain Score 04/19/21 1003 0     Pain Loc --      Pain Edu? --      Excl. in GC? --    No data found.  Updated Vital Signs BP (!) 177/78 (BP Location: Right Arm)   Pulse 95   Temp 98 F (36.7 C) (Oral)   Resp 18   SpO2 97%   Visual Acuity Right Eye Distance:   Left Eye Distance:   Bilateral Distance:    Right Eye Near:   Left Eye Near:    Bilateral Near:     Physical Exam Vitals and nursing note reviewed.  Constitutional:      Appearance: She is well-developed.     Comments: No acute distress  HENT:     Head: Normocephalic and atraumatic.     Ears:     Comments: Bilateral ears without tenderness to palpation of external auricle, tragus and mastoid, EAC's  without erythema or swelling, TM's with good bony landmarks and cone of light. Non erythematous.      Nose: Nose normal.     Mouth/Throat:     Comments: Oral mucosa pink and moist, no tonsillar enlargement or exudate. Posterior pharynx patent and nonerythematous, no uvula deviation or swelling. Normal phonation.  Eyes:     Conjunctiva/sclera: Conjunctivae normal.  Cardiovascular:     Rate and Rhythm: Normal rate and regular rhythm.  Pulmonary:     Effort: Pulmonary effort is normal. No respiratory distress.     Comments: Breathing comfortably at rest, CTABL, no wheezing, rales or other adventitious sounds auscultated  Abdominal:     General: There is no distension.  Musculoskeletal:        General: Normal range of motion.     Cervical back: Neck supple.  Skin:    General: Skin is warm and dry.  Neurological:     Mental Status: She is alert and oriented to person, place, and time.     UC Treatments / Results  Labs (all labs ordered are listed, but only abnormal results are displayed) Labs Reviewed - No data to display  EKG   Radiology No results found.  Procedures Procedures (including critical care time)  Medications Ordered in UC Medications - No data to display  Initial Impression / Assessment and Plan / UC Course  I have reviewed the triage vital signs and the nursing notes.  Pertinent labs & imaging results that were available during my care of the patient were reviewed by me and considered in my medical decision making (see chart for details).     Cough/lower respiratory infection-covering with azithromycin to cover atypicals, otherwise lungs clear to auscultation, Tessalon and Robitussin AC for cough, rest and fluids with continued close monitoring.  Deferring any steroids at this time, but may consider as well as with chest x-ray symptoms continuing to be persistent despite today's recommendations.  Discussed strict return precautions. Patient verbalized  understanding and is agreeable with plan.  Final Clinical Impressions(s) / UC Diagnoses   Final diagnoses:  Lower respiratory infection (e.g., bronchitis, pneumonia, pneumonitis, pulmonitis)  Cough     Discharge Instructions      Please begin Z-Pak-2 tabs today, 1 tab for the following 4 days Tessalon every 8 hours for cough during the day Robitussin with codeine at bedtime Rest and drink plenty of fluids Honey and hot tea Follow-up with nonimprovement or worsening  ED Prescriptions     Medication Sig Dispense Auth. Provider   azithromycin (ZITHROMAX) 250 MG tablet Take 1 tablet (250 mg total) by mouth daily. Take first 2 tablets together, then 1 every day until finished. 6 tablet Ithzel Fedorchak C, PA-C   benzonatate (TESSALON) 200 MG capsule Take 1 capsule (200 mg total) by mouth 3 (three) times daily as needed for up to 7 days for cough. 28 capsule [place] C, PA-C   guaiFENesin-codeine 100-10 MG/5ML syrup Take 5-10 mLs by mouth at bedtime as needed for cough. 120 mL Tierra Thoma, [place] C, PA-C      PDMP not reviewed this encounter.   Lew Dawes, [place] 04/19/21 1152

## 2021-04-19 NOTE — Discharge Instructions (Addendum)
Please begin Z-Pak-2 tabs today, 1 tab for the following 4 days Tessalon every 8 hours for cough during the day Robitussin with codeine at bedtime Rest and drink plenty of fluids Honey and hot tea Follow-up with nonimprovement or worsening

## 2021-04-19 NOTE — ED Triage Notes (Signed)
Pt c/o cough for two weeks, started as a sore throat (now gone).

## 2021-05-12 ENCOUNTER — Other Ambulatory Visit: Payer: Self-pay | Admitting: Obstetrics and Gynecology

## 2021-05-12 DIAGNOSIS — M8580 Other specified disorders of bone density and structure, unspecified site: Secondary | ICD-10-CM

## 2021-05-29 LAB — EXTERNAL GENERIC LAB PROCEDURE: COLOGUARD: NEGATIVE

## 2021-05-29 LAB — COLOGUARD: COLOGUARD: NEGATIVE

## 2021-07-01 ENCOUNTER — Ambulatory Visit
Admission: RE | Admit: 2021-07-01 | Discharge: 2021-07-01 | Disposition: A | Discharge status: Home / Self Care | Payer: BC Managed Care – PPO | Source: Ambulatory Visit | Attending: Obstetrics and Gynecology | Admitting: Obstetrics and Gynecology

## 2021-07-01 ENCOUNTER — Other Ambulatory Visit: Payer: Self-pay

## 2021-07-01 DIAGNOSIS — M8580 Other specified disorders of bone density and structure, unspecified site: Secondary | ICD-10-CM

## 2021-12-24 DIAGNOSIS — Z85828 Personal history of other malignant neoplasm of skin: Secondary | ICD-10-CM | POA: Diagnosis not present

## 2021-12-24 DIAGNOSIS — L661 Lichen planopilaris: Secondary | ICD-10-CM | POA: Diagnosis not present

## 2021-12-24 DIAGNOSIS — D1801 Hemangioma of skin and subcutaneous tissue: Secondary | ICD-10-CM | POA: Diagnosis not present

## 2021-12-24 DIAGNOSIS — L821 Other seborrheic keratosis: Secondary | ICD-10-CM | POA: Diagnosis not present

## 2022-03-15 ENCOUNTER — Ambulatory Visit (HOSPITAL_COMMUNITY)
Admission: EM | Admit: 2022-03-15 | Discharge: 2022-03-15 | Disposition: A | Discharge status: Home / Self Care | Payer: BC Managed Care – PPO | Attending: Emergency Medicine | Admitting: Emergency Medicine

## 2022-03-15 ENCOUNTER — Encounter (HOSPITAL_COMMUNITY): Payer: Self-pay | Admitting: Emergency Medicine

## 2022-03-15 ENCOUNTER — Ambulatory Visit (INDEPENDENT_AMBULATORY_CARE_PROVIDER_SITE_OTHER): Payer: BC Managed Care – PPO

## 2022-03-15 DIAGNOSIS — M79641 Pain in right hand: Secondary | ICD-10-CM

## 2022-03-15 DIAGNOSIS — S6991XA Unspecified injury of right wrist, hand and finger(s), initial encounter: Secondary | ICD-10-CM

## 2022-03-15 DIAGNOSIS — M7989 Other specified soft tissue disorders: Secondary | ICD-10-CM | POA: Diagnosis not present

## 2022-03-15 DIAGNOSIS — W19XXXA Unspecified fall, initial encounter: Secondary | ICD-10-CM | POA: Diagnosis not present

## 2022-03-15 NOTE — ED Provider Notes (Signed)
[facility]    CSN: [id number] Arrival date & time: 03/15/22  1414      History   Chief Complaint Chief Complaint  Patient presents with   Hand Injury    HPI Linda Esparza is a 66 y.o. female.   Patient presents with right hand swelling beginning today after fall.  Endorses that she was walking and tripped causing her to fall onto the bilateral knees, attempted to catch herself palm downward.  Range of motion intact.  Denies numbness or tingling, pain.  Has not attempted treatment of symptoms.  Past Medical History:  Diagnosis Date   Headache(784.0)    migraines    There are no problems to display for this patient.   Past Surgical History:  Procedure Laterality Date   COLONOSCOPY     DILATION AND CURETTAGE OF UTERUS     EYE SURGERY     left eye   THYROID LOBECTOMY  11/08/2011   Procedure: THYROID LOBECTOMY;  Surgeon: Serena Colonel, MD;  Location: [facility] OR;  Service: ENT;  Laterality: Left;    OB History   No obstetric history on file.      Home Medications    Prior to Admission medications   Medication Sig Start Date End Date Taking? Authorizing Provider  azithromycin (ZITHROMAX) 250 MG tablet Take 1 tablet (250 mg total) by mouth daily. Take first 2 tablets together, then 1 every day until finished. 04/19/21   Wieters, Hallie C, PA-C  cholecalciferol (VITAMIN D) 1000 UNITS tablet Take 1,000 Units by mouth daily.    [provider]  Cinnamon 500 MG TABS Take 1 tablet by mouth daily.    [provider]  guaiFENesin-codeine 100-10 MG/5ML syrup Take 5-10 mLs by mouth at bedtime as needed for cough. 04/19/21   Wieters, Hallie C, PA-C  Multiple Vitamin (MULITIVITAMIN WITH MINERALS) TABS Take 1 tablet by mouth daily.    [provider]    Family History History reviewed. No pertinent family history.  Social History Social History   Tobacco Use   Smoking status: Never  Substance Use Topics   Drug use: No     Allergies    Penicillins   Review of Systems Review of Systems  Constitutional: Negative.   Respiratory: Negative.    Cardiovascular: Negative.   Musculoskeletal:  Positive for myalgias. Negative for arthralgias, back pain, gait problem, joint swelling, neck pain and neck stiffness.  Skin: Negative.   Neurological: Negative.      Physical Exam Triage Vital Signs ED Triage Vitals  Enc Vitals Group     BP 03/15/22 1434 (!) 202/84     Pulse Rate 03/15/22 1428 88     Resp 03/15/22 1428 16     Temp 03/15/22 1428 98.1 F (36.7 C)     Temp Source 03/15/22 1428 Oral     SpO2 03/15/22 1428 99 %     Weight --      Height --      Head Circumference --      Peak Flow --      Pain Score 03/15/22 1435 3     Pain Loc --      Pain Edu? --      Excl. in GC? --    No data found.  Updated Vital Signs BP (!) 202/84 (BP Location: Right Arm)   Pulse 88   Temp 98.1 F (36.7 C) (Oral)   Resp 16   SpO2 99%   Visual Acuity Right  Eye Distance:   Left Eye Distance:   Bilateral Distance:    Right Eye Near:   Left Eye Near:    Bilateral Near:     Physical Exam Constitutional:      Appearance: Normal appearance.  HENT:     Head: Normocephalic.  Eyes:     Extraocular Movements: Extraocular movements intact.  Pulmonary:     Effort: Pulmonary effort is normal.  Musculoskeletal:     Comments: Moderate to severe swelling and ecchymosis over the scaphoid on the right hand, range of motion's of all 5 metacarpals intact, range of motion of wrist intact, 2+ radial pulse, sensation intact  Skin:    General: Skin is warm and dry.  Neurological:     Mental Status: She is alert and oriented to person, place, and time. Mental status is at baseline.  Psychiatric:        Mood and Affect: Mood normal.        Behavior: Behavior normal.      UC Treatments / Results  Labs (all labs ordered are listed, but only abnormal results are displayed) Labs Reviewed - No data to  display  EKG   Radiology DG Hand Complete Right  Result Date: 03/15/2022 CLINICAL DATA:  Trauma, fall EXAM: RIGHT HAND - COMPLETE 3+ VIEW COMPARISON:  None Available. FINDINGS: No fracture or dislocation is seen. There is soft tissue swelling over the dorsum of the hand. There are no opaque foreign bodies. Degenerative changes with bony spurs are noted in the distal interphalangeal joint of the index finger. IMPRESSION: No fracture or dislocation is seen in right hand. Electronically Signed   By: Ernie Avena M.D.   On: 03/15/2022 14:53    Procedures Procedures (including critical care time)  Medications Ordered in UC Medications - No data to display  Initial Impression / Assessment and Plan / UC Course  I have reviewed the triage vital signs and the nursing notes.  Pertinent labs & imaging results that were available during my care of the patient were reviewed by me and considered in my medical decision making (see chart for details).  Injury of right hand, initial encounter  X-ray negative, discussed findings with patient, recommended over-the-counter ibuprofen and RICE for outpatient management, wrapped site with compression prior to discharge, given walking referral to orthopedics if symptoms persist past 2 weeks Final Clinical Impressions(s) / UC Diagnoses   Final diagnoses:  Injury of right hand, initial encounter     Discharge Instructions      Your x-ray today did not show injury to the bone. Your pain is most likely being caused by irritation to the soft tissues, this should improve as time progresses.   Take ibuprofen 600 mg (3 tablets) 3 times daily (every 8 hours) for 5 days, this is to help reduce the swelling  You may apply ice in 10 to 15-minute intervals for the next 24 hours then you may switch over to heat if you find it helpful   You may continue activity as tolerated, there is no injury therefore, it is important that you continue to move around so  you do not loose strength to the area  For the first 2-3 days you may wrap ankle with ace wrap for additional support while completing activities, once wrapped if you begin to experience numbness or tingling it is too tight, remove and redo, you should be able to easily fit one finger under wrap   If symptoms persist past 2 weeks, you  may follow up at urgent care or with orthopedic specialist for evaluation, an orthopedic doctor specializes in the bone, they may provide  management such as but not limited to imaging, long term medications and physical therapy    ED Prescriptions   None    PDMP not reviewed this encounter.   Valinda Hoar, NP 03/15/22 1510

## 2022-03-15 NOTE — Discharge Instructions (Addendum)
Your x-ray today did not show injury to the bone. Your pain is most likely being caused by irritation to the soft tissues, this should improve as time progresses.   Take ibuprofen 600 mg (3 tablets) 3 times daily (every 8 hours) for 5 days, this is to help reduce the swelling  You may apply ice in 10 to 15-minute intervals for the next 24 hours then you may switch over to heat if you find it helpful   You may continue activity as tolerated, there is no injury therefore, it is important that you continue to move around so you do not loose strength to the area  For the first 2-3 days you may wrap ankle with ace wrap for additional support while completing activities, once wrapped if you begin to experience numbness or tingling it is too tight, remove and redo, you should be able to easily fit one finger under wrap   If symptoms persist past 2 weeks, you may follow up at urgent care or with orthopedic specialist for evaluation, an orthopedic doctor specializes in the bone, they may provide  management such as but not limited to imaging, long term medications and physical therapy

## 2022-03-15 NOTE — ED Triage Notes (Addendum)
Patient c/o RT hand injury that happened today.   Patient endorses onset of symptoms began after falling in the parking lot.   Patient denies LOC or hitting head. Patient denies any difficulty with moving hand.   Patient has bruising present to dorsal side of RT hand.   Patient has ice present to area with no relief of symptoms.   Patient has taken Motrin with some relief of symptoms.

## 2022-03-28 DIAGNOSIS — Z85828 Personal history of other malignant neoplasm of skin: Secondary | ICD-10-CM | POA: Diagnosis not present

## 2022-03-28 DIAGNOSIS — L661 Lichen planopilaris: Secondary | ICD-10-CM | POA: Diagnosis not present

## 2022-03-28 DIAGNOSIS — L900 Lichen sclerosus et atrophicus: Secondary | ICD-10-CM | POA: Diagnosis not present

## 2022-06-17 DIAGNOSIS — Z1339 Encounter for screening examination for other mental health and behavioral disorders: Secondary | ICD-10-CM | POA: Diagnosis not present

## 2022-06-17 DIAGNOSIS — Z1231 Encounter for screening mammogram for malignant neoplasm of breast: Secondary | ICD-10-CM | POA: Diagnosis not present

## 2022-06-17 DIAGNOSIS — Z1211 Encounter for screening for malignant neoplasm of colon: Secondary | ICD-10-CM | POA: Diagnosis not present

## 2022-06-17 DIAGNOSIS — Z124 Encounter for screening for malignant neoplasm of cervix: Secondary | ICD-10-CM | POA: Diagnosis not present

## 2022-06-17 DIAGNOSIS — Z0000 Encounter for general adult medical examination without abnormal findings: Secondary | ICD-10-CM | POA: Diagnosis not present

## 2022-12-29 DIAGNOSIS — M79644 Pain in right finger(s): Secondary | ICD-10-CM | POA: Diagnosis not present

## 2022-12-30 DIAGNOSIS — M79644 Pain in right finger(s): Secondary | ICD-10-CM | POA: Diagnosis not present

## 2023-01-04 ENCOUNTER — Other Ambulatory Visit: Payer: Self-pay

## 2023-01-04 DIAGNOSIS — L97119 Non-pressure chronic ulcer of right thigh with unspecified severity: Secondary | ICD-10-CM | POA: Diagnosis not present

## 2023-01-04 DIAGNOSIS — R2231 Localized swelling, mass and lump, right upper limb: Secondary | ICD-10-CM | POA: Diagnosis not present

## 2023-01-26 DIAGNOSIS — Z1231 Encounter for screening mammogram for malignant neoplasm of breast: Secondary | ICD-10-CM | POA: Diagnosis not present

## 2023-03-20 DIAGNOSIS — D2361 Other benign neoplasm of skin of right upper limb, including shoulder: Secondary | ICD-10-CM | POA: Diagnosis not present

## 2023-03-20 DIAGNOSIS — Z85828 Personal history of other malignant neoplasm of skin: Secondary | ICD-10-CM | POA: Diagnosis not present

## 2023-03-20 DIAGNOSIS — L821 Other seborrheic keratosis: Secondary | ICD-10-CM | POA: Diagnosis not present

## 2023-03-20 DIAGNOSIS — D225 Melanocytic nevi of trunk: Secondary | ICD-10-CM | POA: Diagnosis not present

## 2023-03-20 DIAGNOSIS — L570 Actinic keratosis: Secondary | ICD-10-CM | POA: Diagnosis not present

## 2023-04-18 DIAGNOSIS — M25562 Pain in left knee: Secondary | ICD-10-CM | POA: Diagnosis not present

## 2023-06-27 DIAGNOSIS — I10 Essential (primary) hypertension: Secondary | ICD-10-CM | POA: Diagnosis not present

## 2023-06-27 DIAGNOSIS — R3915 Urgency of urination: Secondary | ICD-10-CM | POA: Diagnosis not present

## 2023-06-27 DIAGNOSIS — Z13220 Encounter for screening for lipoid disorders: Secondary | ICD-10-CM | POA: Diagnosis not present

## 2023-06-27 DIAGNOSIS — Z0000 Encounter for general adult medical examination without abnormal findings: Secondary | ICD-10-CM | POA: Diagnosis not present

## 2023-06-29 DIAGNOSIS — R2231 Localized swelling, mass and lump, right upper limb: Secondary | ICD-10-CM | POA: Diagnosis not present

## 2023-07-14 DIAGNOSIS — I10 Essential (primary) hypertension: Secondary | ICD-10-CM | POA: Diagnosis not present

## 2023-07-14 DIAGNOSIS — E785 Hyperlipidemia, unspecified: Secondary | ICD-10-CM | POA: Diagnosis not present

## 2023-09-29 DIAGNOSIS — R2231 Localized swelling, mass and lump, right upper limb: Secondary | ICD-10-CM | POA: Diagnosis not present

## 2023-10-09 ENCOUNTER — Encounter: Payer: Self-pay | Admitting: Internal Medicine

## 2023-10-09 ENCOUNTER — Ambulatory Visit: Payer: BC Managed Care – PPO | Attending: Internal Medicine | Admitting: Internal Medicine

## 2023-10-09 VITALS — BP 160/88 | HR 78 | Ht 62.5 in | Wt 153.8 lb

## 2023-10-09 DIAGNOSIS — R921 Mammographic calcification found on diagnostic imaging of breast: Secondary | ICD-10-CM | POA: Diagnosis not present

## 2023-10-09 DIAGNOSIS — E785 Hyperlipidemia, unspecified: Secondary | ICD-10-CM | POA: Diagnosis not present

## 2023-10-09 DIAGNOSIS — I10 Essential (primary) hypertension: Secondary | ICD-10-CM | POA: Diagnosis not present

## 2023-10-09 NOTE — Patient Instructions (Signed)
 Medication Instructions:  Your physician recommends that you continue on your current medications as directed. Please refer to the Current Medication list given to you today.  *If you need a refill on your cardiac medications before your next appointment, please call your pharmacy*  Testing/Procedures: Dr. Amanda Jungling has ordered a CT coronary calcium  score.   This is $99 out of pocket.   Coronary CalciumScan A coronary calcium  scan is an imaging test used to look for deposits of calcium  and other fatty materials (plaques) in the inner lining of the blood vessels of the heart (coronary arteries). These deposits of calcium  and plaques can partly clog and narrow the coronary arteries without producing any symptoms or warning signs. This puts a person at risk for a heart attack. This test can detect these deposits before symptoms develop. Tell a health care provider about: Any allergies you have. All medicines you are taking, including vitamins, herbs, eye drops, creams, and over-the-counter medicines. Any problems you or family members have had with anesthetic medicines. Any blood disorders you have. Any surgeries you have had. Any medical conditions you have. Whether you are pregnant or may be pregnant. What are the risks? Generally, this is a safe procedure. However, problems may occur, including: Harm to a pregnant woman and her unborn baby. This test involves the use of radiation. Radiation exposure can be dangerous to a pregnant woman and her unborn baby. If you are pregnant, you generally should not have this procedure done. Slight increase in the risk of cancer. This is because of the radiation involved in the test. What happens before the procedure? No preparation is needed for this procedure. What happens during the procedure? You will undress and remove any jewelry around your neck or chest. You will put on a hospital gown. Sticky electrodes will be placed on your chest. The  electrodes will be connected to an electrocardiogram (ECG) machine to record a tracing of the electrical activity of your heart. A CT scanner will take pictures of your heart. During this time, you will be asked to lie still and hold your breath for 2-3 seconds while a picture of your heart is being taken. The procedure may vary among health care providers and hospitals. What happens after the procedure? You can get dressed. You can return to your normal activities. It is up to you to get the results of your test. Ask your health care provider, or the department that is doing the test, when your results will be ready. Summary A coronary calcium  scan is an imaging test used to look for deposits of calcium  and other fatty materials (plaques) in the inner lining of the blood vessels of the heart (coronary arteries). Generally, this is a safe procedure. Tell your health care provider if you are pregnant or may be pregnant. No preparation is needed for this procedure. A CT scanner will take pictures of your heart. You can return to your normal activities after the scan is done. This information is not intended to replace advice given to you by your health care provider. Make sure you discuss any questions you have with your health care provider. Document Released: 02/11/2008 Document Revised: 07/04/2016 Document Reviewed: 07/04/2016 Elsevier Interactive Patient Education  2017 [employer].    Follow-Up: At [facility], you and your health needs are our priority.  As part of our continuing mission to provide you with exceptional heart care, we have created designated Provider Care Teams.  These Care Teams include your primary  Cardiologist (physician) and Advanced Practice Providers (APPs -  Physician Assistants and Nurse Practitioners) who all work together to provide you with the care you need, when you need it.  Your next appointment:    As needed

## 2023-10-09 NOTE — Progress Notes (Signed)
  Cardiology Office Note:  .   Date:  10/09/2023  ID:  Linda Esparza, DOB 1956-05-22, MRN [id number] PCP: Pcp, No  [facility] Providers Cardiologist:  None    History of Present Illness: .   Linda Esparza is a 68 y.o. female with history of a hypertension, who was referred from her primary care physician at [facility] to discuss a history of breast arterial calcifications.  BP is high today. She is on amlodipine-olmesartan 5-20 mg daily. BP is well controlled at home.  Her father had history of CAD and CABG, in his 80s.  Her mother had CAD and hypertension, passed at 30.  She never smoked  ROS:  per HPI otherwise negative   Studies Reviewed: Aaron Aas       EKG Interpretation Date/Time:  Monday October 09 2023 15:43:54 EST Ventricular Rate:  79 PR Interval:  154 QRS Duration:  80 QT Interval:  360 QTC Calculation: 412 R Axis:   -3  Text Interpretation: Normal sinus rhythm Cannot rule out Anterior infarct , age undetermined No previous ECGs available Confirmed by Alois Arnt ([id number]) on 10/09/2023 3:55:23 PM        Physical Exam:   VS:   Vitals:   10/09/23 1535  BP: (!) 160/88  Pulse: 78  SpO2: 97%      Wt Readings from Last 3 Encounters:  11/08/11 155 lb 4.8 oz (70.4 kg)  11/02/11 155 lb 4.8 oz (70.4 kg)    GEN: Well nourished, well developed in no acute distress NECK: No JVD CARDIAC: RRR, no murmurs, rubs, gallops RESPIRATORY:  Clear to auscultation without rales, wheezing or rhonchi  ABDOMEN: Soft, non-tender, non-distended EXTREMITIES:  No edema; No deformity   ASSESSMENT AND PLAN: .   Breast arterial calcification -CAC score  HTN Well-controlled at home.  Suspect whitecoat hypertension.  Continue current regimen.       Dispo: Follow-up as needed  Signed, Jacquelynn Friend, Tomas Fountain, MD

## 2023-10-12 ENCOUNTER — Ambulatory Visit (HOSPITAL_COMMUNITY)
Admission: RE | Admit: 2023-10-12 | Discharge: 2023-10-12 | Disposition: A | Discharge status: Home / Self Care | Payer: Self-pay | Source: Ambulatory Visit | Attending: Internal Medicine | Admitting: Internal Medicine

## 2023-10-12 DIAGNOSIS — E785 Hyperlipidemia, unspecified: Secondary | ICD-10-CM | POA: Insufficient documentation

## 2023-10-17 ENCOUNTER — Encounter: Payer: Self-pay | Admitting: *Deleted

## 2023-10-19 ENCOUNTER — Telehealth: Payer: Self-pay | Admitting: *Deleted

## 2023-10-19 DIAGNOSIS — R931 Abnormal findings on diagnostic imaging of heart and coronary circulation: Secondary | ICD-10-CM

## 2023-10-19 MED ORDER — ROSUVASTATIN CALCIUM 10 MG PO TABS: 10.0000 mg | ORAL_TABLET | Freq: Every day | ORAL | 3 refills | Status: AC

## 2023-10-19 NOTE — Telephone Encounter (Signed)
 Pt has reviewed results via my chart  New script sent to the pharmacy  Lab orders mailed to the pt

## 2023-10-19 NOTE — Telephone Encounter (Signed)
-----   Message from Carolan Clines E sent at 10/17/2023 10:32 AM EST ----- Please let Mrs. Kutter know that she did have mild plaque in her LAD. She is asymptomatic. I recommend starting crestor 10 mg daily and a fasting lipid profile in 3 months, Lp(a). Also recommend regular BP ambulatory monitoring.

## 2023-11-02 DIAGNOSIS — R2231 Localized swelling, mass and lump, right upper limb: Secondary | ICD-10-CM | POA: Diagnosis not present

## 2023-11-02 DIAGNOSIS — M79644 Pain in right finger(s): Secondary | ICD-10-CM | POA: Diagnosis not present

## 2023-12-12 DIAGNOSIS — Z1331 Encounter for screening for depression: Secondary | ICD-10-CM | POA: Diagnosis not present

## 2023-12-12 DIAGNOSIS — Z124 Encounter for screening for malignant neoplasm of cervix: Secondary | ICD-10-CM | POA: Diagnosis not present

## 2023-12-12 DIAGNOSIS — Z01419 Encounter for gynecological examination (general) (routine) without abnormal findings: Secondary | ICD-10-CM | POA: Diagnosis not present

## 2024-06-24 DIAGNOSIS — L918 Other hypertrophic disorders of the skin: Secondary | ICD-10-CM | POA: Diagnosis not present

## 2024-06-24 DIAGNOSIS — D1801 Hemangioma of skin and subcutaneous tissue: Secondary | ICD-10-CM | POA: Diagnosis not present

## 2024-06-24 DIAGNOSIS — L6611 Classic lichen planopilaris: Secondary | ICD-10-CM | POA: Diagnosis not present

## 2024-06-24 DIAGNOSIS — L819 Disorder of pigmentation, unspecified: Secondary | ICD-10-CM | POA: Diagnosis not present

## 2024-06-24 DIAGNOSIS — L821 Other seborrheic keratosis: Secondary | ICD-10-CM | POA: Diagnosis not present

## 2024-06-24 DIAGNOSIS — Z85828 Personal history of other malignant neoplasm of skin: Secondary | ICD-10-CM | POA: Diagnosis not present

## 2024-06-24 DIAGNOSIS — I788 Other diseases of capillaries: Secondary | ICD-10-CM | POA: Diagnosis not present

## 2024-06-24 DIAGNOSIS — L900 Lichen sclerosus et atrophicus: Secondary | ICD-10-CM | POA: Diagnosis not present

## 2024-07-30 DIAGNOSIS — Z1231 Encounter for screening mammogram for malignant neoplasm of breast: Secondary | ICD-10-CM | POA: Diagnosis not present

## 2024-09-08 ENCOUNTER — Other Ambulatory Visit (HOSPITAL_BASED_OUTPATIENT_CLINIC_OR_DEPARTMENT_OTHER): Payer: Self-pay | Admitting: Family Medicine

## 2024-09-08 DIAGNOSIS — Z780 Asymptomatic menopausal state: Secondary | ICD-10-CM
